# Patient Record
Sex: Female | Born: 1960 | Race: Black or African American | Hispanic: No | Marital: Married | Smoking: Never smoker
Health system: Southern US, Community
[De-identification: ages and names within clinical notes are randomized; demographics above are authoritative.]

## PROBLEM LIST (undated history)

## (undated) HISTORY — PX: ANKLE ARTHODESIS W/ ARTHROSCOPY: SUR63

---

## 1998-12-08 ENCOUNTER — Encounter: Payer: Self-pay | Admitting: Family Medicine

## 1998-12-08 ENCOUNTER — Ambulatory Visit (HOSPITAL_COMMUNITY): Admission: RE | Admit: 1998-12-08 | Discharge: 1998-12-08 | Payer: Self-pay | Admitting: Family Medicine

## 2000-05-03 ENCOUNTER — Encounter: Payer: Self-pay | Admitting: Family Medicine

## 2000-05-03 ENCOUNTER — Ambulatory Visit (HOSPITAL_COMMUNITY): Admission: RE | Admit: 2000-05-03 | Discharge: 2000-05-03 | Payer: Self-pay | Admitting: Family Medicine

## 2001-05-05 ENCOUNTER — Emergency Department (HOSPITAL_COMMUNITY): Admission: EM | Admit: 2001-05-05 | Discharge: 2001-05-05 | Payer: Self-pay | Admitting: Emergency Medicine

## 2001-05-05 ENCOUNTER — Encounter: Payer: Self-pay | Admitting: Emergency Medicine

## 2002-07-29 ENCOUNTER — Other Ambulatory Visit: Admission: RE | Admit: 2002-07-29 | Discharge: 2002-07-29 | Payer: Self-pay | Admitting: Obstetrics and Gynecology

## 2004-07-27 ENCOUNTER — Other Ambulatory Visit: Admission: RE | Admit: 2004-07-27 | Discharge: 2004-07-27 | Payer: Self-pay | Admitting: Family Medicine

## 2004-08-16 ENCOUNTER — Emergency Department (HOSPITAL_COMMUNITY): Admission: EM | Admit: 2004-08-16 | Discharge: 2004-08-16 | Payer: Self-pay | Admitting: *Deleted

## 2004-09-28 ENCOUNTER — Ambulatory Visit (HOSPITAL_COMMUNITY): Admission: RE | Admit: 2004-09-28 | Discharge: 2004-09-28 | Payer: Self-pay | Admitting: Family Medicine

## 2008-10-26 ENCOUNTER — Other Ambulatory Visit: Admission: RE | Admit: 2008-10-26 | Discharge: 2008-10-26 | Payer: Self-pay | Admitting: Family Medicine

## 2009-01-01 ENCOUNTER — Ambulatory Visit: Payer: Self-pay | Admitting: Gynecology

## 2009-01-01 ENCOUNTER — Encounter: Payer: Self-pay | Admitting: Gynecology

## 2009-03-04 ENCOUNTER — Ambulatory Visit: Payer: Self-pay | Admitting: Women's Health

## 2009-08-20 ENCOUNTER — Emergency Department (HOSPITAL_COMMUNITY): Admission: EM | Admit: 2009-08-20 | Discharge: 2009-08-20 | Payer: Self-pay | Admitting: Emergency Medicine

## 2009-08-21 ENCOUNTER — Emergency Department (HOSPITAL_COMMUNITY): Admission: EM | Admit: 2009-08-21 | Discharge: 2009-08-21 | Payer: Self-pay | Admitting: Emergency Medicine

## 2011-04-24 ENCOUNTER — Encounter: Payer: Self-pay | Admitting: Women's Health

## 2011-05-01 ENCOUNTER — Other Ambulatory Visit: Payer: Self-pay

## 2011-05-01 ENCOUNTER — Other Ambulatory Visit (HOSPITAL_COMMUNITY)
Admission: RE | Admit: 2011-05-01 | Discharge: 2011-05-01 | Disposition: A | Discharge status: Home / Self Care | Payer: BC Managed Care – PPO | Source: Ambulatory Visit | Attending: Internal Medicine | Admitting: Internal Medicine

## 2011-05-01 DIAGNOSIS — Z01419 Encounter for gynecological examination (general) (routine) without abnormal findings: Secondary | ICD-10-CM | POA: Insufficient documentation

## 2011-08-17 ENCOUNTER — Emergency Department (HOSPITAL_COMMUNITY): Payer: No Typology Code available for payment source

## 2011-08-17 ENCOUNTER — Emergency Department (HOSPITAL_COMMUNITY)
Admission: EM | Admit: 2011-08-17 | Discharge: 2011-08-17 | Disposition: A | Discharge status: Home / Self Care | Payer: No Typology Code available for payment source | Attending: Emergency Medicine | Admitting: Emergency Medicine

## 2011-08-17 DIAGNOSIS — M545 Low back pain, unspecified: Secondary | ICD-10-CM | POA: Insufficient documentation

## 2011-08-17 DIAGNOSIS — M549 Dorsalgia, unspecified: Secondary | ICD-10-CM | POA: Insufficient documentation

## 2011-08-17 DIAGNOSIS — Y92410 Unspecified street and highway as the place of occurrence of the external cause: Secondary | ICD-10-CM | POA: Insufficient documentation

## 2011-08-17 DIAGNOSIS — T1490XA Injury, unspecified, initial encounter: Secondary | ICD-10-CM | POA: Insufficient documentation

## 2012-03-21 ENCOUNTER — Encounter (HOSPITAL_COMMUNITY): Payer: Self-pay | Admitting: Emergency Medicine

## 2012-03-21 ENCOUNTER — Emergency Department (HOSPITAL_COMMUNITY): Payer: No Typology Code available for payment source

## 2012-03-21 ENCOUNTER — Emergency Department (HOSPITAL_COMMUNITY)
Admission: EM | Admit: 2012-03-21 | Discharge: 2012-03-21 | Disposition: A | Discharge status: Home / Self Care | Payer: No Typology Code available for payment source | Attending: Emergency Medicine | Admitting: Emergency Medicine

## 2012-03-21 DIAGNOSIS — M545 Low back pain, unspecified: Secondary | ICD-10-CM | POA: Insufficient documentation

## 2012-03-21 DIAGNOSIS — Q762 Congenital spondylolisthesis: Secondary | ICD-10-CM | POA: Insufficient documentation

## 2012-03-21 DIAGNOSIS — M542 Cervicalgia: Secondary | ICD-10-CM | POA: Insufficient documentation

## 2012-03-21 DIAGNOSIS — M549 Dorsalgia, unspecified: Secondary | ICD-10-CM

## 2012-03-21 MED ORDER — KETOROLAC TROMETHAMINE 60 MG/2ML IM SOLN
60.0000 mg | Freq: Once | INTRAMUSCULAR | Status: AC
  Administered 2012-03-21: 60 mg via INTRAMUSCULAR
  Filled 2012-03-21: qty 2

## 2012-03-21 MED ORDER — DIAZEPAM 5 MG PO TABS: 5.0000 mg | ORAL_TABLET | Freq: Two times a day (BID) | ORAL | Status: AC

## 2012-03-21 MED ORDER — IBUPROFEN 800 MG PO TABS: 800.0000 mg | ORAL_TABLET | Freq: Three times a day (TID) | ORAL | Status: AC
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
## 2012-03-21 NOTE — ED Provider Notes (Signed)
Medical screening examination/treatment/procedure(s) were performed by non-physician practitioner and as supervising physician I was immediately available for consultation/collaboration.  Shenandoah Yeats M Kortland Nichols, MD 03/21/12 0840 
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
## 2012-03-21 NOTE — ED Notes (Signed)
Pt remains in xray.

## 2012-03-21 NOTE — ED Notes (Signed)
Patient transported to X-ray 
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
## 2012-11-26 ENCOUNTER — Other Ambulatory Visit: Payer: Self-pay | Admitting: Family Medicine

## 2012-11-26 ENCOUNTER — Other Ambulatory Visit (HOSPITAL_COMMUNITY)
Admission: RE | Admit: 2012-11-26 | Discharge: 2012-11-26 | Disposition: A | Discharge status: Home / Self Care | Payer: 59 | Source: Ambulatory Visit | Attending: Family Medicine | Admitting: Family Medicine

## 2012-11-26 DIAGNOSIS — Z0000 Encounter for general adult medical examination without abnormal findings: Secondary | ICD-10-CM | POA: Insufficient documentation

## 2012-12-13 ENCOUNTER — Ambulatory Visit (INDEPENDENT_AMBULATORY_CARE_PROVIDER_SITE_OTHER): Payer: BC Managed Care – PPO | Admitting: Surgery

## 2013-04-18 ENCOUNTER — Other Ambulatory Visit: Payer: Self-pay | Admitting: Gastroenterology
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
## 2014-02-02 ENCOUNTER — Other Ambulatory Visit: Payer: Self-pay | Admitting: Family Medicine

## 2014-02-02 ENCOUNTER — Other Ambulatory Visit (HOSPITAL_COMMUNITY)
Admission: RE | Admit: 2014-02-02 | Discharge: 2014-02-02 | Disposition: A | Discharge status: Home / Self Care | Payer: 59 | Source: Ambulatory Visit | Attending: Family Medicine | Admitting: Family Medicine

## 2014-02-02 DIAGNOSIS — Z0000 Encounter for general adult medical examination without abnormal findings: Secondary | ICD-10-CM | POA: Insufficient documentation

## 2014-03-16 ENCOUNTER — Ambulatory Visit (INDEPENDENT_AMBULATORY_CARE_PROVIDER_SITE_OTHER): Payer: BC Managed Care – PPO | Admitting: Surgery

## 2014-03-23 ENCOUNTER — Encounter (INDEPENDENT_AMBULATORY_CARE_PROVIDER_SITE_OTHER): Payer: Self-pay | Admitting: Surgery

## 2014-03-23 ENCOUNTER — Ambulatory Visit (INDEPENDENT_AMBULATORY_CARE_PROVIDER_SITE_OTHER): Payer: 59 | Admitting: Surgery

## 2014-03-23 VITALS — BP 134/80 | HR 76 | Temp 97.3°F | Resp 14 | Ht 64.0 in | Wt 164.6 lb

## 2014-03-23 DIAGNOSIS — D179 Benign lipomatous neoplasm, unspecified: Secondary | ICD-10-CM

## 2014-03-23 NOTE — Patient Instructions (Signed)

## 2014-03-23 NOTE — Progress Notes (Signed)
Patient ID: Theresa Mccarthy, female   DOB: Jul 19, 1961, 53 y.o.   MRN: [id number]  Chief Complaint  Patient presents with  . nodule on right knee    HPI Theresa Mccarthy is a 53 y.o. female.  Pt sent at request of Camie Fulp NP for small mass on pt right leg.  Noticed a few months ago.  Not painful but does have knee joint pain.  It does not drain or show signs of redness.  HPI  History reviewed. No pertinent past medical history.  Past Surgical History  Procedure Laterality Date  . Ankle arthodesis w/ arthroscopy      Family History  Problem Relation Age of Onset  . Cancer Mother     liver    Social History History  Substance Use Topics  . Smoking status: Never Smoker   . Smokeless tobacco: Not on file  . Alcohol Use: No    No Known Allergies  No current outpatient prescriptions on file.   No current facility-administered medications for this visit.    Review of Systems Review of Systems  Constitutional: Negative.   HENT: Negative.   Eyes: Negative.   Respiratory: Negative.   Cardiovascular: Negative.   Gastrointestinal: Negative.   Endocrine: Negative.   Genitourinary: Negative.   Musculoskeletal: Positive for arthralgias.  Skin: Negative.   Hematological: Negative.   Psychiatric/Behavioral: Negative.     Blood pressure 134/80, pulse 76, temperature 97.3 F (36.3 C), resp. rate 14, height 5\' 4"  (1.626 m), weight 164 lb 9.6 oz (74.662 kg).  Physical Exam Physical Exam  Constitutional: She is oriented to person, place, and time. She appears well-developed and well-nourished.  Eyes: Pupils are equal, round, and reactive to light. No scleral icterus.  Neck: Normal range of motion. Neck supple.  Musculoskeletal: Normal range of motion.  Neurological: She is alert and oriented to person, place, and time.  Skin:     Psychiatric: She has a normal mood and affect. Her behavior is normal. Judgment and thought content normal.    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  Can be observed given small size.  Discussed surgery and lipoma history.  Information given.  Pt will think things over.        [name] A. Janiesha Diehl 03/23/2014, 9:35 AM

## 2014-09-23 ENCOUNTER — Encounter (HOSPITAL_COMMUNITY): Payer: Self-pay | Admitting: Emergency Medicine

## 2014-09-23 ENCOUNTER — Emergency Department (HOSPITAL_COMMUNITY)
Admission: EM | Admit: 2014-09-23 | Discharge: 2014-09-24 | Disposition: A | Discharge status: Home / Self Care | Payer: 59 | Attending: Emergency Medicine | Admitting: Emergency Medicine

## 2014-09-23 DIAGNOSIS — R079 Chest pain, unspecified: Secondary | ICD-10-CM | POA: Diagnosis present

## 2014-09-23 DIAGNOSIS — R0789 Other chest pain: Secondary | ICD-10-CM | POA: Diagnosis not present

## 2014-09-23 NOTE — ED Notes (Signed)
Pt c/o R flank pain, denies dysuria, denies n/v/d. Pt states this began as back pain today then radiated to flank

## 2014-09-24 ENCOUNTER — Emergency Department (HOSPITAL_COMMUNITY): Payer: 59

## 2014-09-24 LAB — COMPREHENSIVE METABOLIC PANEL
ALK PHOS: 98 U/L (ref 39–117)
ALT: 8 U/L (ref 0–35)
ANION GAP: 10 (ref 5–15)
AST: 25 U/L (ref 0–37)
Albumin: 4 g/dL (ref 3.5–5.2)
BILIRUBIN TOTAL: 0.2 mg/dL — AB (ref 0.3–1.2)
BUN: 13 mg/dL (ref 6–23)
CHLORIDE: 99 meq/L (ref 96–112)
CO2: 28 mEq/L (ref 19–32)
Calcium: 10 mg/dL (ref 8.4–10.5)
Creatinine, Ser: 0.99 mg/dL (ref 0.50–1.10)
GFR calc non Af Amer: 64 mL/min — ABNORMAL LOW (ref >90)
GFR, EST AFRICAN AMERICAN: 75 mL/min — AB (ref >90)
GLUCOSE: 113 mg/dL — AB (ref 70–99)
POTASSIUM: 4.1 meq/L (ref 3.7–5.3)
Sodium: 137 mEq/L (ref 137–147)
TOTAL PROTEIN: 8.1 g/dL (ref 6.0–8.3)

## 2014-09-24 LAB — CBC WITH DIFFERENTIAL/PLATELET
Basophils Absolute: 0.1 10*3/uL (ref 0.0–0.1)
Basophils Relative: 1 % (ref 0–1)
EOS ABS: 0.3 10*3/uL (ref 0.0–0.7)
Eosinophils Relative: 4 % (ref 0–5)
HEMATOCRIT: 35.4 % — AB (ref 36.0–46.0)
HEMOGLOBIN: 11.6 g/dL — AB (ref 12.0–15.0)
LYMPHS ABS: 2.6 10*3/uL (ref 0.7–4.0)
Lymphocytes Relative: 33 % (ref 12–46)
MCH: 26.4 pg (ref 26.0–34.0)
MCHC: 32.8 g/dL (ref 30.0–36.0)
MCV: 80.5 fL (ref 78.0–100.0)
MONOS PCT: 7 % (ref 3–12)
Monocytes Absolute: 0.5 10*3/uL (ref 0.1–1.0)
NEUTROS PCT: 55 % (ref 43–77)
Neutro Abs: 4.3 10*3/uL (ref 1.7–7.7)
Platelets: 357 10*3/uL (ref 150–400)
RBC: 4.4 MIL/uL (ref 3.87–5.11)
RDW: 14 % (ref 11.5–15.5)
WBC: 7.7 10*3/uL (ref 4.0–10.5)

## 2014-09-24 LAB — URINALYSIS, ROUTINE W REFLEX MICROSCOPIC
BILIRUBIN URINE: NEGATIVE
Glucose, UA: NEGATIVE mg/dL
KETONES UR: NEGATIVE mg/dL
NITRITE: NEGATIVE
PROTEIN: NEGATIVE mg/dL
Specific Gravity, Urine: 1.029 (ref 1.005–1.030)
UROBILINOGEN UA: 0.2 mg/dL (ref 0.0–1.0)
pH: 6.5 (ref 5.0–8.0)

## 2014-09-24 LAB — URINE MICROSCOPIC-ADD ON

## 2014-09-24 MED ORDER — CYCLOBENZAPRINE HCL 10 MG PO TABS: 10.0000 mg | ORAL_TABLET | Freq: Two times a day (BID) | ORAL | Status: DC | PRN

## 2014-09-24 MED ORDER — OXYCODONE-ACETAMINOPHEN 5-325 MG PO TABS
1.0000 | ORAL_TABLET | Freq: Once | ORAL | Status: AC
Start: 2014-09-24 — End: 2014-09-24
  Administered 2014-09-24: 1 via ORAL
  Filled 2014-09-24: qty 1

## 2014-09-24 MED ORDER — OXYCODONE-ACETAMINOPHEN 5-325 MG PO TABS: 1.0000 | ORAL_TABLET | ORAL | Status: DC | PRN

## 2014-09-24 NOTE — ED Notes (Signed)
Bed: WA09 Expected date:  Expected time:  Means of arrival:  Comments: 

## 2014-09-24 NOTE — ED Provider Notes (Signed)
CSN: [id number]     Arrival date & time 09/23/14  2310 History   First MD Initiated Contact with Patient 09/24/14 0325     Chief Complaint  Patient presents with  . Flank Pain     (Consider location/radiation/quality/duration/timing/severity/associated sxs/prior Treatment) Patient is a 53 y.o. female presenting with chest pain. The history is provided by the patient. No language interpreter was used.  Chest Pain Pain location:  R lateral chest Pain quality: sharp and stabbing   Pain radiates to:  Does not radiate Pain radiates to the back: no   Pain severity:  Moderate Timing:  Sporadic Associated symptoms: no abdominal pain, no cough, no fever, no nausea, no numbness, no palpitations, no shortness of breath, not vomiting and no weakness   Associated symptoms comment:  Right lateral lower chest wall pain that is sharp, shooting and without pleuritic component. No known injury. No SOB, N, V, cough or fever. It feels worse with movement and better if she is applying pressure to the area.    History reviewed. No pertinent past medical history. Past Surgical History  Procedure Laterality Date  . Ankle arthodesis w/ arthroscopy     Family History  Problem Relation Age of Onset  . Cancer Mother     liver   History  Substance Use Topics  . Smoking status: Never Smoker   . Smokeless tobacco: Not on file  . Alcohol Use: No   OB History    No data available     Review of Systems  Constitutional: Negative for fever.  HENT: Negative for congestion and sore throat.   Respiratory: Negative for cough and shortness of breath.   Cardiovascular: Positive for chest pain. Negative for palpitations and leg swelling.  Gastrointestinal: Negative for nausea, vomiting and abdominal pain.  Genitourinary: Negative for flank pain.  Musculoskeletal: Negative for myalgias.  Skin: Negative for rash.  Neurological: Negative for weakness and numbness.      Allergies  Review of patient's  allergies indicates no known allergies.  Home Medications   Prior to Admission medications   Medication Sig Start Date End Date Taking? Authorizing Provider  traMADol (ULTRAM) 50 MG tablet Take 50 mg by mouth every 6 (six) hours as needed for moderate pain.   Yes Historical Provider, MD   BP 152/84 mmHg  Pulse 74  Temp(Src) 97.9 F (36.6 C) (Oral)  Resp 16  Ht 5\' 4"  (1.626 m)  Wt 165 lb (74.844 kg)  BMI 28.31 kg/m2  SpO2 100%  LMP 03/04/2012 Physical Exam  Constitutional: She is oriented to person, place, and time. She appears well-developed and well-nourished.  HENT:  Head: Normocephalic.  Neck: Normal range of motion. Neck supple.  Cardiovascular: Normal rate.   Pulmonary/Chest: Effort normal. She exhibits tenderness.    Abdominal: Soft. Bowel sounds are normal. There is no tenderness. There is no rebound and no guarding.  Genitourinary:  No CVA tenderness.   Musculoskeletal: Normal range of motion.  Neurological: She is alert and oriented to person, place, and time.  Skin: Skin is warm and dry. No rash noted.  Psychiatric: She has a normal mood and affect.    ED Course  Procedures (including critical care time) Labs Review Labs Reviewed  URINALYSIS, ROUTINE W REFLEX MICROSCOPIC - Abnormal; Notable for the following:    APPearance CLOUDY (*)    Hgb urine dipstick MODERATE (*)    Leukocytes, UA MODERATE (*)    All other components within normal limits  URINE MICROSCOPIC-ADD ON -  Abnormal; Notable for the following:    Squamous Epithelial / LPF FEW (*)    Bacteria, UA FEW (*)    All other components within normal limits  CBC WITH DIFFERENTIAL - Abnormal; Notable for the following:    Hemoglobin 11.6 (*)    HCT 35.4 (*)    All other components within normal limits  COMPREHENSIVE METABOLIC PANEL - Abnormal; Notable for the following:    Glucose, Bld 113 (*)    Total Bilirubin 0.2 (*)    GFR calc non Af Amer 64 (*)    GFR calc Af Amer 75 (*)    All other  components within normal limits    Imaging Review Dg Chest 2 View  09/24/2014   CLINICAL DATA:  Increasing RIGHT flank pain over 24 hr.  EXAM: CHEST  2 VIEW  COMPARISON:  Thoracic spine radiographs Mar 21, 2012  FINDINGS: Cardiomediastinal silhouette is unremarkable. The lungs are clear without pleural effusions or focal consolidations. Trachea projects midline and there is no pneumothorax. Soft tissue planes and included osseous structures are non-suspicious. Mild degenerative change of the thoracic spine.  IMPRESSION: No active cardiopulmonary disease.   Electronically Signed   By: Elon Alas   On: 09/24/2014 04:19     EKG Interpretation None      MDM   Final diagnoses:  Chest pain    1. Chest wall pain  CXR clear, no evidence of PNA or pulmonary abnormality. No pleuritic pain, cough or fever. Abdomen is completely non-tender, specifically, no RUQ tenderness. No flank pain or urinary symptoms. There is mild leukorrhea - culture pending. Reproducible tenderness to right lower chest wall - suspect muscular pain. Symptomatic treatment.    Dewaine Oats, PA-C 09/24/14 0535  Julianne Rice, MD 09/25/14 [phone]

## 2014-09-24 NOTE — ED Notes (Signed)
Pt ambulatory upon discharge. She reports that her husband is driving her home and that she is leaving with ALL belongings she arrived with.

## 2014-09-24 NOTE — ED Notes (Signed)
PA at bedside.

## 2014-09-24 NOTE — Discharge Instructions (Signed)
Chest Wall Pain °Chest wall pain is pain in or around the bones and muscles of your chest. It may take up to 6 weeks to get better. It may take longer if you must stay physically active in your work and activities.  °CAUSES  °Chest wall pain may happen on its own. However, it may be caused by: °· A viral illness like the flu. °· Injury. °· Coughing. °· Exercise. °· Arthritis. °· Fibromyalgia. °· Shingles. °HOME CARE INSTRUCTIONS  °· Avoid overtiring physical activity. Try not to strain or perform activities that cause pain. This includes any activities using your chest or your abdominal and side muscles, especially if heavy weights are used. °· Put ice on the sore area. °· Put ice in a plastic bag. °· Place a towel between your skin and the bag. °· Leave the ice on for 15-20 minutes per hour while awake for the first 2 days. °· Only take over-the-counter or prescription medicines for pain, discomfort, or fever as directed by your caregiver. °SEEK IMMEDIATE MEDICAL CARE IF:  °· Your pain increases, or you are very uncomfortable. °· You have a fever. °· Your chest pain becomes worse. °· You have new, unexplained symptoms. °· You have nausea or vomiting. °· You feel sweaty or lightheaded. °· You have a cough with phlegm (sputum), or you cough up blood. °MAKE SURE YOU:  °· Understand these instructions. °· Will watch your condition. °· Will get help right away if you are not doing well or get worse. °Document Released: 11/06/2005 Document Revised: 01/29/2012 Document Reviewed: 07/03/2011 °ExitCare® Patient Information ©2015 [employer]. This information is not intended to replace advice given to you by your health care provider. Make sure you discuss any questions you have with your health care provider. °Heat Therapy °Heat therapy can help ease sore, stiff, injured, and tight muscles and joints. Heat relaxes your muscles, which may help ease your pain.  °RISKS AND COMPLICATIONS °If you have any of the following  conditions, do not use heat therapy unless your health care provider has approved: °· Poor circulation. °· Healing wounds or scarred skin in the area being treated. °· Diabetes, heart disease, or high blood pressure. °· Not being able to feel (numbness) the area being treated. °· Unusual swelling of the area being treated. °· Active infections. °· Blood clots. °· Cancer. °· Inability to communicate pain. This may include young children and people who have problems with their brain function (dementia). °· Pregnancy. °Heat therapy should only be used on old, pre-existing, or long-lasting (chronic) injuries. Do not use heat therapy on new injuries unless directed by your health care provider. °HOW TO USE HEAT THERAPY °There are several different kinds of heat therapy, including: °· Moist heat pack. °· Warm water bath. °· Hot water bottle. °· Electric heating pad. °· Heated gel pack. °· Heated wrap. °· Electric heating pad. °Use the heat therapy method suggested by your health care provider. Follow your health care provider's instructions on when and how to use heat therapy. °GENERAL HEAT THERAPY RECOMMENDATIONS °· Do not sleep while using heat therapy. Only use heat therapy while you are awake. °· Your skin may turn pink while using heat therapy. Do not use heat therapy if your skin turns red. °· Do not use heat therapy if you have new pain. °· High heat or long exposure to heat can cause burns. Be careful when using heat therapy to avoid burning your skin. °· Do not use heat therapy on areas   of your skin that are already irritated, such as with a rash or sunburn. °SEEK MEDICAL CARE IF: °· You have blisters, redness, swelling, or numbness. °· You have new pain. °· Your pain is worse. °MAKE SURE YOU: °· Understand these instructions. °· Will watch your condition. °· Will get help right away if you are not doing well or get worse. °Document Released: 01/29/2012 Document Revised: 03/23/2014 Document Reviewed:  12/30/2013 °ExitCare® Patient Information ©2015 [employer]. This information is not intended to replace advice given to you by your health care provider. Make sure you discuss any questions you have with your health care provider. ° °

## 2015-06-15 ENCOUNTER — Ambulatory Visit
Admission: RE | Admit: 2015-06-15 | Discharge: 2015-06-15 | Disposition: A | Discharge status: Home / Self Care | Payer: Commercial Managed Care - HMO | Source: Ambulatory Visit | Attending: Family Medicine | Admitting: Family Medicine

## 2015-06-15 ENCOUNTER — Other Ambulatory Visit: Payer: Self-pay | Admitting: Family Medicine

## 2015-06-15 DIAGNOSIS — M79642 Pain in left hand: Principal | ICD-10-CM

## 2015-06-15 DIAGNOSIS — M79641 Pain in right hand: Secondary | ICD-10-CM

## 2015-06-15 DIAGNOSIS — M25531 Pain in right wrist: Secondary | ICD-10-CM

## 2015-06-15 DIAGNOSIS — M25532 Pain in left wrist: Secondary | ICD-10-CM

## 2016-06-06 ENCOUNTER — Other Ambulatory Visit (HOSPITAL_COMMUNITY)
Admission: RE | Admit: 2016-06-06 | Discharge: 2016-06-06 | Disposition: A | Discharge status: Home / Self Care | Payer: BC Managed Care – PPO | Source: Ambulatory Visit | Attending: Family Medicine | Admitting: Family Medicine

## 2016-06-06 ENCOUNTER — Other Ambulatory Visit: Payer: Self-pay | Admitting: Family Medicine

## 2016-06-06 DIAGNOSIS — Z01419 Encounter for gynecological examination (general) (routine) without abnormal findings: Secondary | ICD-10-CM | POA: Diagnosis present

## 2016-06-08 LAB — CYTOLOGY - PAP

## 2017-05-04 ENCOUNTER — Encounter: Payer: Self-pay | Admitting: Registered"

## 2017-05-04 ENCOUNTER — Encounter: Payer: BC Managed Care – PPO | Attending: Family Medicine | Admitting: Registered"

## 2017-05-04 DIAGNOSIS — Z713 Dietary counseling and surveillance: Secondary | ICD-10-CM | POA: Insufficient documentation

## 2017-05-04 DIAGNOSIS — R739 Hyperglycemia, unspecified: Secondary | ICD-10-CM

## 2017-05-04 DIAGNOSIS — R7309 Other abnormal glucose: Secondary | ICD-10-CM | POA: Diagnosis not present

## 2017-05-04 NOTE — Patient Instructions (Addendum)
   Aim to eat at least 3 times per day  Include a protein when you eat carbohydrates  Consider reading food labels for total carbohydrates

## 2017-05-04 NOTE — Progress Notes (Signed)
Medical Nutrition Therapy:  Appt start time: 0805 end time:  0905.  Assessment:  Primary concerns today: Pt states she is here because her doctor put in the referral but patient was not clear why. Per Dr. Paulino Rily referral, pt has A1c of 6.1 and she recommends patient have pre-diabetes education.   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 Pt states she is able to keep her stress level low.  Sleep: avg 6 hrs per night. Pt states with school out she should be able to get more sleep.  Preferred Learning Style:   No preference indicated   Learning Readiness:   Contemplating  MEDICATIONS: none   DIETARY INTAKE:  Usual eating pattern includes 1 meals and 1-2 snacks per day.  24-hr recall:  B ( AM): none OR sprite/pepsi  Snk ( AM): none OR chips  L ( PM): none OR pizza OR burger/fast food OR salad Snk ( PM): none OR fruit (loves fruit) D (10-11 PM): shrimp OR frozen pizza Snk ( PM): none Beverages: sprite, pepsi, water  Usual physical activity: ADL   Estimated energy needs: 1600 calories 180 g carbohydrates 120 g protein 44 g fat  Progress Towards Goal(s):  In progress.   Nutritional Diagnosis:  NB-1.1 Food and nutrition-related knowledge deficit As related to carbohydrate effect on BG.  As evidenced by patient states lack of understanding role of food groups including carbohydrates in the body and A1c of 6.1.    Intervention:  Nutrition Education for managing blood glucose with diet and lifestyle changes. Discussed importance of not skipping meals. Defined the role of glucose and insulin. Described the role of different macronutrients on glucose.  Explained how carbohydrates affect blood glucose.  Stated what foods contain the most carbohydrates. Demonstrated how to read Nutrition Facts food label.   Aim to eat at least 3 times per day  Include a protein when you eat  carbohydrates  Consider reading food labels for total carbohydrates  Teaching Method Utilized:  Visual Auditory  Handouts given during visit include:  Pages from Living Well with Diabetes  Carbs and meal planning  My Plate  Barriers to learning/adherence to lifestyle change: busy work schedule when working 10 months during school.  Demonstrated degree of understanding via:  Teach Back   Monitoring/Evaluation:  Dietary intake, exercise, and body weight prn.

## 2019-06-05 ENCOUNTER — Other Ambulatory Visit (HOSPITAL_COMMUNITY)
Admission: RE | Admit: 2019-06-05 | Discharge: 2019-06-05 | Disposition: A | Discharge status: Home / Self Care | Payer: BC Managed Care – PPO | Source: Ambulatory Visit | Attending: Nurse Practitioner | Admitting: Nurse Practitioner

## 2019-06-05 ENCOUNTER — Other Ambulatory Visit: Payer: Self-pay | Admitting: Nurse Practitioner

## 2019-06-05 DIAGNOSIS — Z01419 Encounter for gynecological examination (general) (routine) without abnormal findings: Secondary | ICD-10-CM | POA: Insufficient documentation

## 2019-06-10 LAB — CYTOLOGY - PAP
Diagnosis: NEGATIVE
HPV: NOT DETECTED

## 2022-01-06 ENCOUNTER — Emergency Department (HOSPITAL_COMMUNITY): Payer: BC Managed Care – PPO

## 2022-01-06 ENCOUNTER — Emergency Department (HOSPITAL_COMMUNITY)
Admission: EM | Admit: 2022-01-06 | Discharge: 2022-01-06 | Disposition: A | Discharge status: Home / Self Care | Payer: BC Managed Care – PPO | Attending: Emergency Medicine | Admitting: Emergency Medicine

## 2022-01-06 ENCOUNTER — Encounter (HOSPITAL_COMMUNITY): Payer: Self-pay

## 2022-01-06 DIAGNOSIS — S81812A Laceration without foreign body, left lower leg, initial encounter: Secondary | ICD-10-CM | POA: Insufficient documentation

## 2022-01-06 DIAGNOSIS — W268XXA Contact with other sharp object(s), not elsewhere classified, initial encounter: Secondary | ICD-10-CM | POA: Diagnosis not present

## 2022-01-06 DIAGNOSIS — S8992XA Unspecified injury of left lower leg, initial encounter: Secondary | ICD-10-CM | POA: Diagnosis present

## 2022-01-06 MED ORDER — TETANUS-DIPHTH-ACELL PERTUSSIS 5-2.5-18.5 LF-MCG/0.5 IM SUSY
0.5000 mL | PREFILLED_SYRINGE | Freq: Once | INTRAMUSCULAR | Status: DC
  Filled 2022-01-06: qty 0.5

## 2022-01-06 MED ORDER — LIDOCAINE-EPINEPHRINE (PF) 2 %-1:200000 IJ SOLN
10.0000 mL | Freq: Once | INTRAMUSCULAR | Status: DC
  Filled 2022-01-06: qty 20

## 2022-01-06 NOTE — ED Provider Notes (Signed)
[facility] DEPT Provider Note   CSN: [id number] Arrival date & time: 01/06/22  1346     History  Chief Complaint  Patient presents with   Extremity Laceration    Theresa Mccarthy is a 61 y.o. female.  HPI  Patient presents with laceration the anterior of her left distal leg/upper ankle.  laceration is roughly 2.5 cm, no bony involvement  Bleeding controlled, this happened when she broke a porcelain vase.  Her last tetanus was 6 years ago, she is not sure if it was 6 or 7.  She is not diabetic immunocompromise anyway, no recent antibiotics.  Denies any paresthesias. Not on blood thinners.  Home Medications Prior to Admission medications   Medication Sig Start Date End Date Taking? Authorizing Provider  cyclobenzaprine (FLEXERIL) 10 MG tablet Take 1 tablet (10 mg total) by mouth 2 (two) times daily as needed for muscle spasms. Patient not taking: Reported on 05/04/2017 09/24/14   Charlann Lange, PA-C  Multiple Vitamins-Iron (MULTIVITAMIN/IRON PO) Take by mouth.    [provider]  oxyCODONE-acetaminophen (PERCOCET/ROXICET) 5-325 MG per tablet Take 1-2 tablets by mouth every 4 (four) hours as needed for severe pain. Patient not taking: Reported on 05/04/2017 09/24/14   Charlann Lange, PA-C  traMADol (ULTRAM) 50 MG tablet Take 50 mg by mouth every 6 (six) hours as needed for moderate pain.    [provider]      Allergies    Patient has no known allergies.    Review of Systems   Review of Systems  Skin:  Positive for wound.   Physical Exam Updated Vital Signs BP (!) 141/78 (BP Location: Right Arm)    Pulse 84    Temp 98.2 F (36.8 C) (Oral)    Resp 16    LMP 03/04/2012    SpO2 95%  Physical Exam Vitals and nursing note reviewed. Exam conducted with a chaperone present.  Constitutional:      General: She is not in acute distress.    Appearance: Normal appearance.  HENT:     Head: Normocephalic and atraumatic.  Eyes:     General: No  scleral icterus.    Extraocular Movements: Extraocular movements intact.     Pupils: Pupils are equal, round, and reactive to light.  Cardiovascular:     Rate and Rhythm: Normal rate and regular rhythm.     Pulses: Normal pulses.     Comments: DP and PT are 2+ bilaterally Musculoskeletal:        General: Normal range of motion.  Skin:    Capillary Refill: Capillary refill takes less than 2 seconds.     Coloration: Skin is not jaundiced.     Comments: 2.5 cm lacerations to the anterior of her left distal leg/upper ankle.  Neurological:     Mental Status: She is alert. Mental status is at baseline.     Coordination: Coordination normal.    ED Results / Procedures / Treatments   Labs (all labs ordered are listed, but only abnormal results are displayed) Labs Reviewed - No data to display  EKG None  Radiology DG Tibia/Fibula Left  Result Date: 01/06/2022 CLINICAL DATA:  Fall with laceration EXAM: LEFT TIBIA AND FIBULA - 2 VIEW COMPARISON:  None. FINDINGS: No evidence of an acute regional fracture. There is soft tissue swelling of the anterior soft tissues. Bandage artifact overlies the region, but there is a small density which could represent a foreign object, measuring about 3 mm in size. IMPRESSION:  No evidence of acute fracture. Soft tissue injury. Overlying bandage density. Question small radiopaque foreign object as marked by arrow. This could be re-evaluated without the bandages if desired. Electronically Signed   By: Nelson Chimes M.D.   On: 01/06/2022 16:29    Procedures .Marland KitchenLaceration Repair  Date/Time: 01/06/2022 4:26 PM Performed by: Sherrill Raring, PA-C Authorized by: Sherrill Raring, PA-C   Consent:    Consent obtained:  Verbal   Consent given by:  Patient   Risks discussed:  Infection, need for additional repair, pain, poor cosmetic result and poor wound healing   Alternatives discussed:  No treatment and delayed treatment Universal protocol:    Procedure explained and  questions answered to patient or proxy's satisfaction: yes     Relevant documents present and verified: yes     Test results available: yes     Imaging studies available: yes     Required blood products, implants, devices, and special equipment available: yes     Site/side marked: yes     Immediately prior to procedure, a time out was called: yes     Patient identity confirmed:  Verbally with patient Anesthesia:    Anesthesia method:  Local infiltration   Local anesthetic:  Lidocaine 1% WITH epi Laceration details:    Location:  Leg   Leg location:  L lower leg   Length (cm):  2.5   Depth (mm):  2 Pre-procedure details:    Preparation:  Patient was prepped and draped in usual sterile fashion and imaging obtained to evaluate for foreign bodies Exploration:    Limited defect created (wound extended): yes     Hemostasis achieved with:  Direct pressure   Imaging obtained: x-ray     Imaging outcome: foreign body not noted     Wound exploration: wound explored through full range of motion and entire depth of wound visualized     Contaminated: no   Treatment:    Area cleansed with:  Povidone-iodine   Amount of cleaning:  Standard   Irrigation solution:  Sterile saline   Irrigation volume:  250   Irrigation method:  Pressure wash   Visualized foreign bodies/material removed: no   Skin repair:    Repair method:  Sutures   Suture size:  4-0   Suture material:  Nylon   Suture technique:  Simple interrupted   Number of sutures:  5 Approximation:    Approximation:  Close Repair type:    Repair type:  Simple Post-procedure details:    Dressing:  Antibiotic ointment and non-adherent dressing   Procedure completion:  Tolerated well, no immediate complications    Medications Ordered in ED Medications - No data to display   ED Course/ Medical Decision Making/ A&P                           Medical Decision Making Amount and/or Complexity of Data Reviewed Radiology:  ordered.  Risk Prescription drug management.   This patient presents to the ED for concern of Laceration to left distal lower extremity, this involves an extensive number of treatment options, and is a complaint that carries with it a high risk of complications and morbidity.  The differential diagnosis includes superficial laceration, compartment syndrome, occult fracture, other   Co morbidities that complicate the patient evaluation: N/A.  Additionally, patient has a primary care doctor and insurance.     ED Course: Patient is neurovascularly intact.  She is strong pulses, good  cap refill.  Tolerates range of motion to the knee, hip, ankle, toes.  Sensation intact, compartments are soft.  I am able to visualize the entire wound, it is superficial I do not see any foreign bodies.    Given patient's tetanus was 6 years ago I did attempt to update in the ED today.  Patient does not desire updated with prefer to follow-up with her PCP.  Additionally, I did consider ordering antibiotics but given she is not immunocompromised and is a superficial wound I do not think that would be beneficial.   Considered ordering an x-ray but given his able to visualize the entire wound suspicion for retained foreign body.  After proceeding with laceration repair patient verbalized concern and expressed to the patient.  We dressed the wound, decided to proceed with x-ray based on patient's concern..    I personally viewed the x-ray, I see the images that radiologist called attention to and that is consistent with the dressing.  Given I was able to visualize the extent of the wound I have a low suspicion for any retained foreign body, additionally the site corresponds to a place in the dressing.  Discussed this with the patient advised her to follow-up with her PCP for re-eval and repeat imaging if needed. .  Strict return precautions were discussed.  Imaging Studies ordered: -I ordered imaging studies including DG  Tib/Fib  -I independently visualized and interpreted imaging which showed no evidence of fracture.    Reevaluation: After the interventions noted above, I reevaluated the patient and found that they have :improved   Dispostion: D/C         Final Clinical Impression(s) / ED Diagnoses Final diagnoses:  Laceration of left lower extremity, initial encounter    Rx / DC Orders ED Discharge Orders     None         Sherrill Raring, PA-C 01/06/22 2242    Varney Biles, MD 01/08/22 1414

## 2022-01-06 NOTE — Discharge Instructions (Signed)
Follow-up with your primary care doctor in 7 days to have sutures removed.  You may need to have your tetanus updated at that time.  Wash with soap and water, keep dressing on for 24 hours after that shower.  Apply topical antibiotic ointment and a Band-Aid.  If you see signs of infection such as redness, pus, fevers

## 2022-01-06 NOTE — ED Triage Notes (Signed)
Pt arrived via POV, c/o laceration to left leg from broken porcelain. Bleeding controlled

## 2022-01-06 NOTE — ED Provider Triage Note (Signed)
Emergency Medicine Provider Triage Evaluation Note  Theresa Mccarthy , a 61 y.o. female  was evaluated in triage.  Pt complains of left anterior ankle laceration.  She cut it on a piece of glass when cleaning, 2 small lacerations to the anterior shin.  Not on blood thinners, last tetanus was 7 years ago.  .  Review of Systems  Positive: LAC Negative: Paresthesias  Physical Exam  BP (!) 141/78 (BP Location: Right Arm)    Pulse 84    Temp 98.2 F (36.8 C) (Oral)    Resp 16    LMP 03/04/2012    SpO2 95%  Gen:   Awake, no distress   Resp:  Normal effort  MSK:   Moves extremities without difficulty  Other:  DP PT 2+, able to move digits.  Medical Decision Making  Medically screening exam initiated at 2:30 PM.  Appropriate orders placed.  Reni Hausner was informed that the remainder of the evaluation will be completed by another provider, this initial triage assessment does not replace that evaluation, and the importance of remaining in the ED until their evaluation is complete.     Sherrill Raring, PA-C 01/06/22 1431

## 2022-07-18 ENCOUNTER — Other Ambulatory Visit: Payer: Self-pay

## 2022-07-18 ENCOUNTER — Emergency Department (HOSPITAL_COMMUNITY)
Admission: EM | Admit: 2022-07-18 | Discharge: 2022-07-19 | Disposition: A | Discharge status: Home / Self Care | Payer: BC Managed Care – PPO | Attending: Emergency Medicine | Admitting: Emergency Medicine

## 2022-07-18 ENCOUNTER — Emergency Department (HOSPITAL_COMMUNITY): Payer: BC Managed Care – PPO

## 2022-07-18 DIAGNOSIS — S3991XA Unspecified injury of abdomen, initial encounter: Secondary | ICD-10-CM | POA: Insufficient documentation

## 2022-07-18 DIAGNOSIS — E876 Hypokalemia: Secondary | ICD-10-CM | POA: Insufficient documentation

## 2022-07-18 DIAGNOSIS — R739 Hyperglycemia, unspecified: Secondary | ICD-10-CM | POA: Insufficient documentation

## 2022-07-18 DIAGNOSIS — S161XXA Strain of muscle, fascia and tendon at neck level, initial encounter: Secondary | ICD-10-CM | POA: Diagnosis not present

## 2022-07-18 DIAGNOSIS — Y92410 Unspecified street and highway as the place of occurrence of the external cause: Secondary | ICD-10-CM | POA: Diagnosis not present

## 2022-07-18 DIAGNOSIS — S299XXA Unspecified injury of thorax, initial encounter: Secondary | ICD-10-CM | POA: Insufficient documentation

## 2022-07-18 DIAGNOSIS — S060X0A Concussion without loss of consciousness, initial encounter: Secondary | ICD-10-CM | POA: Diagnosis not present

## 2022-07-18 DIAGNOSIS — S43402A Unspecified sprain of left shoulder joint, initial encounter: Secondary | ICD-10-CM | POA: Diagnosis not present

## 2022-07-18 DIAGNOSIS — S39012A Strain of muscle, fascia and tendon of lower back, initial encounter: Secondary | ICD-10-CM | POA: Insufficient documentation

## 2022-07-18 DIAGNOSIS — S298XXA Other specified injuries of thorax, initial encounter: Secondary | ICD-10-CM

## 2022-07-18 DIAGNOSIS — S0990XA Unspecified injury of head, initial encounter: Secondary | ICD-10-CM | POA: Diagnosis present

## 2022-07-18 LAB — I-STAT CHEM 8, ED
BUN: 11 mg/dL (ref 6–20)
Calcium, Ion: 1.12 mmol/L — ABNORMAL LOW (ref 1.15–1.40)
Chloride: 105 mmol/L (ref 98–111)
Creatinine, Ser: 0.9 mg/dL (ref 0.44–1.00)
Glucose, Bld: 125 mg/dL — ABNORMAL HIGH (ref 70–99)
HCT: 38 % (ref 36.0–46.0)
Hemoglobin: 12.9 g/dL (ref 12.0–15.0)
Potassium: 3.2 mmol/L — ABNORMAL LOW (ref 3.5–5.1)
Sodium: 141 mmol/L (ref 135–145)
TCO2: 24 mmol/L (ref 22–32)

## 2022-07-18 LAB — COMPREHENSIVE METABOLIC PANEL
ALT: 22 U/L (ref 0–44)
AST: 36 U/L (ref 15–41)
Albumin: 3.9 g/dL (ref 3.5–5.0)
Alkaline Phosphatase: 58 U/L (ref 38–126)
Anion gap: 11 (ref 5–15)
BUN: 11 mg/dL (ref 6–20)
CO2: 26 mmol/L (ref 22–32)
Calcium: 9.8 mg/dL (ref 8.9–10.3)
Chloride: 105 mmol/L (ref 98–111)
Creatinine, Ser: 0.99 mg/dL (ref 0.44–1.00)
GFR, Estimated: >60 mL/min (ref >60)
Glucose, Bld: 133 mg/dL — ABNORMAL HIGH (ref 70–99)
Potassium: 3.3 mmol/L — ABNORMAL LOW (ref 3.5–5.1)
Sodium: 142 mmol/L (ref 135–145)
Total Bilirubin: 0.5 mg/dL (ref 0.3–1.2)
Total Protein: 7 g/dL (ref 6.5–8.1)

## 2022-07-18 LAB — CBC
HCT: 36.7 % (ref 36.0–46.0)
Hemoglobin: 11.9 g/dL — ABNORMAL LOW (ref 12.0–15.0)
MCH: 27.4 pg (ref 26.0–34.0)
MCHC: 32.4 g/dL (ref 30.0–36.0)
MCV: 84.6 fL (ref 80.0–100.0)
Platelets: 359 10*3/uL (ref 150–400)
RBC: 4.34 MIL/uL (ref 3.87–5.11)
RDW: 14.6 % (ref 11.5–15.5)
WBC: 6.5 10*3/uL (ref 4.0–10.5)
nRBC: 0 % (ref 0.0–0.2)

## 2022-07-18 LAB — ETHANOL: Alcohol, Ethyl (B): <10 mg/dL (ref <10)

## 2022-07-18 MED ORDER — FENTANYL CITRATE PF 50 MCG/ML IJ SOSY
50.0000 ug | PREFILLED_SYRINGE | Freq: Once | INTRAMUSCULAR | Status: AC
  Administered 2022-07-19: 50 ug via INTRAVENOUS
  Filled 2022-07-18: qty 1
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
## 2022-07-19 ENCOUNTER — Emergency Department (HOSPITAL_COMMUNITY): Payer: BC Managed Care – PPO

## 2022-07-19 MED ORDER — METHOCARBAMOL 500 MG PO TABS: 500.0000 mg | ORAL_TABLET | Freq: Two times a day (BID) | ORAL | 0 refills | Status: AC

## 2022-07-19 MED ORDER — IOHEXOL 300 MG/ML  SOLN
100.0000 mL | Freq: Once | INTRAMUSCULAR | Status: AC | PRN
  Administered 2022-07-19: 100 mL via INTRAVENOUS

## 2022-07-19 NOTE — ED Notes (Signed)
Patient verbalizes understanding of d/c instructions. Opportunities for questions and answers were provided. Pt d/c from ED and ambulated to lobby with daughter.

## 2022-07-19 NOTE — ED Notes (Signed)
Theresa Mccarthy patient's daughter would like to be contacted for updates if needed [phone]

## 2022-07-19 NOTE — ED Notes (Signed)
Pt ambulated independently without difficulties or complaints.

## 2022-09-11 ENCOUNTER — Encounter: Payer: Self-pay | Admitting: *Deleted

## 2022-09-11 ENCOUNTER — Ambulatory Visit: Payer: BC Managed Care – PPO | Admitting: Psychiatry

## 2022-09-13 ENCOUNTER — Ambulatory Visit: Payer: BC Managed Care – PPO | Admitting: Psychiatry

## 2022-09-13 VITALS — BP 156/84 | HR 74 | Ht 63.0 in | Wt 169.5 lb

## 2022-09-13 DIAGNOSIS — G44319 Acute post-traumatic headache, not intractable: Secondary | ICD-10-CM

## 2022-09-13 DIAGNOSIS — R42 Dizziness and giddiness: Secondary | ICD-10-CM | POA: Diagnosis not present

## 2022-09-13 DIAGNOSIS — F0781 Postconcussional syndrome: Secondary | ICD-10-CM
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
## 2022-09-13 NOTE — Patient Instructions (Addendum)
Natural supplements that can reduce headaches: Magnesium Oxide or Magnesium Glycinate 500 mg daily at bedtime (up to 800 mg daily) Coenzyme Q10 300 mg in AM daily Vitamin B2- 200 mg twice a day   Post Concussive Syndrome:  Post-concussion syndrome is a complex disorder in which various symptoms -- such as headaches and dizziness -- last for weeks and sometimes months after the injury that caused the concussion. Concussion is a mild traumatic brain injury, usually occurring after a blow to the head. Loss of consciousness isn't required for a diagnosis of concussion or post-concussion syndrome. In fact, the risk of post-concussion syndrome doesn't appear to be associated with the severity of the initial injury. In most people, post-concussion syndrome symptoms occur within the first seven to 10 days and go away within three months, though they can persist for a year or more. Post-concussion syndrome treatments are aimed at easing specific symptoms.  Post-concussion symptoms include: Headaches  Dizziness  Fatigue  Irritability  Anxiety  Insomnia  Loss of concentration and memory  Noise and light sensitivity  Headaches that occur after a concussion can vary and may feel like tension-type headaches or migraines. Most, however, are tension-type headaches, which may be associated with a neck injury that happened at the same time as the head injury. In some cases, people experience behavior or emotional changes after a mild traumatic brain injury. Family members may notice that the person has become more irritable, suspicious, argumentative or stubborn.  When to see a doctor See a doctor if you experience a head injury severe enough to cause confusion or amnesia -- even if you never lost consciousness. If a concussion occurs while you're playing a sport, don't go back in the game. Seek medical attention so that you don't risk worsening your injury. Causes: In many cases, both physiological  effects of brain trauma and emotional reactions to these effects play a role in the development of symptoms. Researchers haven't determined why some people who've had concussions develop persistent post-concussion symptoms while others do not. No proven correlation between the severity of the injury and the likelihood of developing persistent post-concussion symptoms exists.  Risk Factors: Risk factors for developing post-concussion syndrome include: Age. Studies have found increasing age to be a risk factor for post-concussion syndrome.  Sex. Women are more likely to be diagnosed with post-concussion syndrome, but this may be because women are generally more likely to seek medical care.  Trauma. Concussions resulting from car collisions, falls, assaults and sports injuries are commonly associated with post-concussion syndrome.  Treatment: There is no specific treatment for post-concussion syndrome. Instead, your doctor will treat the individual symptoms you're experiencing. The types of symptoms and their frequency are unique to each person. Headaches Medications commonly used for migraines or tension headaches, including some antidepressants, appear to be effective when these types of headaches are associated with post-concussion syndrome. Examples include: Amitriptyline. This medication has been widely used for post-traumatic injuries, as well as for symptoms commonly associated with post-concussion syndrome, such as irritability, dizziness and depression. Topiramate. Commonly used to treat migraines, topiramate (Qudexy XR, Topamax, Trokendi XR) may be effective in reducing headaches after head injury. Common side effects of topiramate include weight loss and cognitive problems.  Gabapentin. Gabapentin (Gralise, Neurontin) is frequently used to treat a variety of types of pain and may be helpful in treating post-traumatic headaches. A common side effect of gabapentin is drowsiness.  Other agents  used to treat migraines and tension-type headaches may also be helpful  in some individuals. Keep in mind that the overuse of over-the-counter and prescription pain relievers may contribute to persistent post-concussion headaches. Memory and thinking problems No medications are currently recommended specifically for the treatment of cognitive problems after mild traumatic brain injury. Time may be the best therapy for post-concussion syndrome if you have cognitive problems, as most of them go away on their own in the weeks to months following the injury. Certain forms of cognitive therapy may be helpful, including focused rehabilitation that provides training in how to use a pocket calendar, Librarian, academic or other techniques to work around memory deficits and attention skills. Relaxation therapy also may help. Dizziness Vestibular rehab (a specialized form of physical therapy can help this. Depression and anxiety The symptoms of post-concussion syndrome often improve after the affected person learns that there is a cause for his or her symptoms and that they will likely improve with time. Education about the disorder can ease a person's fears and help provide peace of mind. If you're experiencing new or increasing depression or anxiety after a concussion, some treatment options include: Psychotherapy. It may be helpful to discuss your concerns with a psychologist or psychiatrist who has experience in working with people with brain injury.  Medication. To combat anxiety or depression, antidepressants or anti-anxiety medications may be prescribed. Prevention: The only known way to prevent post-concussion syndrome is to avoid the head injury in the first place. Avoiding head injuries Although you can't prepare for every potential situation, here are some tips for avoiding common causes of head injuries: Fasten your seat belt whenever you're traveling in a car, and be sure children are in  age-appropriate safety seats. Children under 13 are safest riding in the back seat, especially if your car has air bags.  Use helmets whenever you or your children are bicycling, roller-skating, in-line skating, ice-skating, skiing, snowboarding, playing football, batting or running the bases in softball or baseball, skateboarding, or horseback riding. Wear a helmet when riding a motorcycle.  Take steps around the house to prevent falls, such as removing small area rugs, improving lighting and installing handrails.

## 2022-11-27 ENCOUNTER — Other Ambulatory Visit: Payer: Self-pay | Admitting: Family Medicine

## 2022-11-27 DIAGNOSIS — G44309 Post-traumatic headache, unspecified, not intractable: Secondary | ICD-10-CM
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
## 2022-12-27 ENCOUNTER — Encounter: Payer: Self-pay | Admitting: Adult Health

## 2022-12-27 ENCOUNTER — Ambulatory Visit: Payer: BC Managed Care – PPO | Admitting: Adult Health

## 2022-12-27 ENCOUNTER — Other Ambulatory Visit: Payer: Self-pay | Admitting: Family Medicine

## 2022-12-27 ENCOUNTER — Ambulatory Visit
Admission: RE | Admit: 2022-12-27 | Discharge: 2022-12-27 | Disposition: A | Discharge status: Home / Self Care | Payer: BC Managed Care – PPO | Source: Ambulatory Visit | Attending: Family Medicine | Admitting: Family Medicine

## 2022-12-27 DIAGNOSIS — R059 Cough, unspecified: Secondary | ICD-10-CM

## 2022-12-27 DIAGNOSIS — R0989 Other specified symptoms and signs involving the circulatory and respiratory systems: Secondary | ICD-10-CM
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
